# Patient Record
Sex: Male | Born: 1980 | Race: Black or African American | Hispanic: No | Marital: Single
Health system: Southern US, Community
[De-identification: ages and names within clinical notes are randomized; demographics above are authoritative.]

## PROBLEM LIST (undated history)

## (undated) DIAGNOSIS — F319 Bipolar disorder, unspecified: Secondary | ICD-10-CM

## (undated) DIAGNOSIS — O2230 Deep phlebothrombosis in pregnancy, unspecified trimester: Secondary | ICD-10-CM

## (undated) DIAGNOSIS — I10 Essential (primary) hypertension: Secondary | ICD-10-CM

---

## 2009-01-20 ENCOUNTER — Emergency Department (HOSPITAL_COMMUNITY): Admission: EM | Admit: 2009-01-20 | Discharge: 2009-01-20 | Payer: Self-pay | Admitting: Family Medicine

## 2009-12-30 ENCOUNTER — Emergency Department (HOSPITAL_COMMUNITY): Admission: AC | Admit: 2009-12-30 | Discharge: 2009-12-30 | Payer: Self-pay

## 2011-01-20 LAB — CBC
HCT: 45.6 % (ref 39.0–52.0)
MCHC: 33.8 g/dL (ref 30.0–36.0)
MCV: 102.3 fL — ABNORMAL HIGH (ref 78.0–100.0)
Platelets: 251 10*3/uL (ref 150–400)
RBC: 4.46 MIL/uL (ref 4.22–5.81)
RDW: 14 % (ref 11.5–15.5)

## 2011-01-20 LAB — BASIC METABOLIC PANEL
CO2: 10 mEq/L — ABNORMAL LOW (ref 19–32)
Calcium: 9.3 mg/dL (ref 8.4–10.5)
GFR calc non Af Amer: 56 mL/min — ABNORMAL LOW (ref >60)
Glucose, Bld: 137 mg/dL — ABNORMAL HIGH (ref 70–99)
Potassium: 4.1 mEq/L (ref 3.5–5.1)

## 2011-01-20 LAB — DIFFERENTIAL
Basophils Relative: 0 % (ref 0–1)
Eosinophils Absolute: 0.3 10*3/uL (ref 0.0–0.7)
Lymphs Abs: 2.5 10*3/uL (ref 0.7–4.0)

## 2011-01-20 LAB — POCT CARDIAC MARKERS
Myoglobin, poc: 265 ng/mL (ref 12–200)
Troponin i, poc: <0.05 ng/mL (ref 0.00–0.09)

## 2011-06-25 ENCOUNTER — Emergency Department (HOSPITAL_COMMUNITY)
Admission: EM | Admit: 2011-06-25 | Discharge: 2011-06-25 | Disposition: A | Discharge status: Home / Self Care | Payer: Self-pay | Attending: Emergency Medicine | Admitting: Emergency Medicine

## 2011-06-25 DIAGNOSIS — I10 Essential (primary) hypertension: Secondary | ICD-10-CM | POA: Insufficient documentation

## 2011-06-25 DIAGNOSIS — M546 Pain in thoracic spine: Secondary | ICD-10-CM | POA: Insufficient documentation

## 2011-06-25 DIAGNOSIS — Z79899 Other long term (current) drug therapy: Secondary | ICD-10-CM | POA: Insufficient documentation

## 2018-04-25 ENCOUNTER — Encounter (HOSPITAL_COMMUNITY): Payer: Self-pay | Admitting: Emergency Medicine

## 2018-04-25 ENCOUNTER — Emergency Department (HOSPITAL_COMMUNITY)
Admission: EM | Admit: 2018-04-25 | Discharge: 2018-04-25 | Disposition: A | Discharge status: Home / Self Care | Payer: Self-pay | Attending: Emergency Medicine | Admitting: Emergency Medicine

## 2018-04-25 ENCOUNTER — Emergency Department (HOSPITAL_COMMUNITY): Payer: Self-pay

## 2018-04-25 DIAGNOSIS — R079 Chest pain, unspecified: Secondary | ICD-10-CM | POA: Insufficient documentation

## 2018-04-25 DIAGNOSIS — Z86718 Personal history of other venous thrombosis and embolism: Secondary | ICD-10-CM | POA: Insufficient documentation

## 2018-04-25 DIAGNOSIS — M79662 Pain in left lower leg: Secondary | ICD-10-CM | POA: Insufficient documentation

## 2018-04-25 DIAGNOSIS — I10 Essential (primary) hypertension: Secondary | ICD-10-CM | POA: Insufficient documentation

## 2018-04-25 DIAGNOSIS — Z7901 Long term (current) use of anticoagulants: Secondary | ICD-10-CM | POA: Insufficient documentation

## 2018-04-25 DIAGNOSIS — F17210 Nicotine dependence, cigarettes, uncomplicated: Secondary | ICD-10-CM | POA: Insufficient documentation

## 2018-04-25 HISTORY — DX: Essential (primary) hypertension: I10

## 2018-04-25 HISTORY — DX: Deep phlebothrombosis in pregnancy, unspecified trimester: O22.30

## 2018-04-25 LAB — CBC WITH DIFFERENTIAL/PLATELET
ABS IMMATURE GRANULOCYTES: 0 10*3/uL (ref 0.0–0.1)
BASOS ABS: 0 10*3/uL (ref 0.0–0.1)
Basophils Relative: 1 %
Eosinophils Absolute: 0.2 10*3/uL (ref 0.0–0.7)
Eosinophils Relative: 4 %
HEMATOCRIT: 42.4 % (ref 39.0–52.0)
HEMOGLOBIN: 14 g/dL (ref 13.0–17.0)
Immature Granulocytes: 0 %
LYMPHS ABS: 1.2 10*3/uL (ref 0.7–4.0)
LYMPHS PCT: 25 %
MCH: 34.1 pg — ABNORMAL HIGH (ref 26.0–34.0)
MCHC: 33 g/dL (ref 30.0–36.0)
MCV: 103.2 fL — ABNORMAL HIGH (ref 78.0–100.0)
MONO ABS: 0.4 10*3/uL (ref 0.1–1.0)
Monocytes Relative: 8 %
Neutro Abs: 3.1 10*3/uL (ref 1.7–7.7)
Neutrophils Relative %: 62 %
Platelets: 272 10*3/uL (ref 150–400)
RBC: 4.11 MIL/uL — AB (ref 4.22–5.81)
RDW: 14.6 % (ref 11.5–15.5)
WBC: 4.9 10*3/uL (ref 4.0–10.5)

## 2018-04-25 LAB — I-STAT TROPONIN, ED: TROPONIN I, POC: 0 ng/mL (ref 0.00–0.08)

## 2018-04-25 LAB — BASIC METABOLIC PANEL
ANION GAP: 7 (ref 5–15)
BUN: 13 mg/dL (ref 6–20)
CHLORIDE: 106 mmol/L (ref 98–111)
CO2: 25 mmol/L (ref 22–32)
Calcium: 9.2 mg/dL (ref 8.9–10.3)
Creatinine, Ser: 0.96 mg/dL (ref 0.61–1.24)
GFR calc Af Amer: >60 mL/min (ref >60)
GLUCOSE: 87 mg/dL (ref 70–99)
POTASSIUM: 3.6 mmol/L (ref 3.5–5.1)
SODIUM: 138 mmol/L (ref 135–145)

## 2018-04-25 MED ORDER — APIXABAN 5 MG PO TABS: 5.0000 mg | ORAL_TABLET | Freq: Two times a day (BID) | ORAL | 0 refills | Status: AC

## 2018-04-25 MED ORDER — IOPAMIDOL (ISOVUE-370) INJECTION 76%
INTRAVENOUS | Status: AC
  Filled 2018-04-25: qty 100

## 2018-04-25 MED ORDER — IOPAMIDOL (ISOVUE-370) INJECTION 76%
100.0000 mL | Freq: Once | INTRAVENOUS | Status: AC | PRN
  Administered 2018-04-25: 100 mL via INTRAVENOUS

## 2018-04-25 NOTE — Discharge Instructions (Signed)
Follow-up at the wellness center for further evaluation. Return to ED for worsening symptoms, chest pain, shortness of breath, coughing up blood.

## 2018-04-25 NOTE — ED Provider Notes (Addendum)
[facility] EMERGENCY DEPARTMENT Provider Note   CSN: [id number] Arrival date & time: 04/25/18  1219     History   Chief Complaint Chief Complaint  Patient presents with  . Leg Pain    HPI Donald Wilson is a 37 y.o. male with a past medical history of DVT on Eliquis, who presents to ED for evaluation of ongoing left leg pain.  He was originally diagnosed with a DVT in 2015.  He has been on Eliquis 5mg  since then up until 2 to 3 months ago.  He states that he has relocated to [place] from [place] and has not yet established with a primary care provider here.  He reports ongoing pain since then.  He states that the DVT that he was diagnosed with was unprovoked but states that he does have a family history of it. He reports some chest pain and shortness of breath intermittently but none currently.  Denies any hemoptysis, fever, history of MI, recent surgeries, recent prolonged travel, syncope.  HPI  Past Medical History:  Diagnosis Date  . 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 Hypertension     There are no active problems to display for this patient.   History reviewed. No pertinent surgical history.      Home Medications    Prior to Admission medications   Medication Sig Start Date End Date Taking? Authorizing Provider  apixaban (ELIQUIS) 5 MG TABS tablet Take 1 tablet (5 mg total) by mouth 2 (two) times daily. 04/25/18   Dietrich Pates, PA-C    Family History No family history on file.  Social History Social History   Tobacco Use  . Smoking status: Current Every Day Smoker    Packs/day: 0.50    Types: Cigarettes  . Smokeless tobacco: Never Used  Substance Use Topics  . Alcohol use: Yes    Comment: occ  . Drug use: Yes    Types: Marijuana     Allergies   Patient has no known allergies.   Review of Systems Review of Systems  Constitutional: Negative for appetite change, chills and fever.  HENT: Negative for ear pain,  rhinorrhea, sneezing and sore throat.   Eyes: Negative for photophobia and visual disturbance.  Respiratory: Positive for shortness of breath. Negative for cough, chest tightness and wheezing.   Cardiovascular: Positive for chest pain. Negative for palpitations.  Gastrointestinal: Negative for abdominal pain, blood in stool, constipation, diarrhea, nausea and vomiting.  Genitourinary: Negative for dysuria, hematuria and urgency.  Musculoskeletal: Negative for myalgias.  Skin: Negative for rash.  Neurological: Negative for dizziness, weakness and light-headedness.     Physical Exam Updated Vital Signs BP (!) 157/86 (BP Location: Right Arm)   Pulse 96   Temp 98.5 F (36.9 C) (Oral)   Resp 18   Ht 6\' 3"  (1.905 m)   Wt 86.2 kg (190 lb)   SpO2 100%   BMI 23.75 kg/m   Physical Exam  Constitutional: He appears well-developed and well-nourished. No distress.  HENT:  Head: Normocephalic and atraumatic.  Nose: Nose normal.  Eyes: Conjunctivae and EOM are normal. Right eye exhibits no discharge. Left eye exhibits no discharge. No scleral icterus.  Neck: Normal range of motion. Neck supple.  Cardiovascular: Normal rate, regular rhythm, normal heart sounds and intact distal pulses. Exam reveals no gallop and no friction rub.  No murmur heard. Pulmonary/Chest: Effort normal and breath sounds normal. No respiratory distress.  Abdominal: Soft. Bowel sounds are normal. He  exhibits no distension. There is no tenderness. There is no guarding.  Musculoskeletal: Normal range of motion. He exhibits no edema.  Left calf tenderness noted.  No erythema, warmth or edema of leg noted. 2+ DP pulses, symmetrically in BLE.  Neurological: He is alert. He exhibits normal muscle tone. Coordination normal.  Skin: Skin is warm and dry. No rash noted.  Psychiatric: He has a normal mood and affect.  Nursing note and vitals reviewed.    ED Treatments / Results  Labs (all labs ordered are listed, but only  abnormal results are displayed) Labs Reviewed  CBC WITH DIFFERENTIAL/PLATELET - Abnormal; Notable for the following components:      Result Value   RBC 4.11 (*)    MCV 103.2 (*)    MCH 34.1 (*)    All other components within normal limits  BASIC METABOLIC PANEL  I-STAT TROPONIN, ED    EKG None  Radiology Ct Angio Chest Pe W/cm &/or Wo Cm  Result Date: 04/25/2018 CLINICAL DATA:  Left lower leg pain chronically but worse recently. No injury. History of DVT as patient out of Eliquis 2 months. EXAM: CT ANGIOGRAPHY CHEST WITH CONTRAST TECHNIQUE: Multidetector CT imaging of the chest was performed using the standard protocol during bolus administration of intravenous contrast. Multiplanar CT image reconstructions and MIPs were obtained to evaluate the vascular anatomy. CONTRAST:  100mL ISOVUE-370 IOPAMIDOL (ISOVUE-370) INJECTION 76% COMPARISON:  None. FINDINGS: Cardiovascular: Heart is normal size. No evidence of pulmonary embolism. Ascending thoracic aorta measures 3.5 cm in AP diameter. Remaining vascular structures are unremarkable. Mediastinum/Nodes: No evidence of mediastinal or hilar adenopathy. Remaining mediastinal structures are normal. Lungs/Pleura: Lungs are well inflated without focal airspace consolidation or effusion. There is a tiny amount of dependent debris over the distal trachea which may be due to aspiration. Upper Abdomen: No acute abnormality. Musculoskeletal: No chest wall abnormality. No acute or significant osseous findings. Review of the MIP images confirms the above findings. IMPRESSION: No acute cardiopulmonary disease. No evidence of pulmonary embolism. 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 Recommend annual imaging followup by CTA or MRA. This recommendation follows 2010 ACCF/AHA/AATS/ACR/ASA/SCA/SCAI/SIR/STS/SVM Guidelines for the Diagnosis and Management of Patients with Thoracic Aortic Disease. Circulation.2010; 121: Z610-R604: e266-e369. Minimal dependent  debris over the distal trachea likely aspirate material. Electronically Signed   By: Elberta Fortisaniel  Boyle M.D.   On: 04/25/2018 16:08    Procedures Procedures (including critical care time)  Medications Ordered in ED Medications  iopamidol (ISOVUE-370) 76 % injection (has no administration in time range)  iopamidol (ISOVUE-370) 76 % injection 100 mL (100 mLs Intravenous Contrast Given 04/25/18 1527)     Initial Impression / Assessment and Plan / ED Course  I have reviewed the triage vital signs and the nursing notes.  Pertinent labs & imaging results that were available during my care of the patient were reviewed by me and considered in my medical decision making (see chart for details).     Patient, with a past medical history of DVT presents to ED for evaluation of ongoing left leg pain.  He was originally diagnosed with a DVT in 2015.  He has been on Eliquis 5 mg since then up until 2 to 3 months ago.  [place] and has not yet established with a primary care provider here.  Some intermittent chest pain and shortness of breath.  Denies any currently. He denies any hemoptysis, fever, history of MI, recent surgeries, recent prolonged travel  or syncope. Patient would like to be checked for blood clot in lung. CTA negative. Labwork including troponin unremarkable.  EKG shows normal sinus rhythm.  He is low risk by heart score.  No signs of DVT on today's examination. Patient will be restarted back on Eliquis 5 mg which he was told to take indefinitely. Will give follow up information for Wellness center for follow up and to return to ED for any severe or worsening symptoms.  Advised to return to ED for any severe worsening symptoms.  Portions of this note were generated with Scientist, clinical (histocompatibility and immunogenetics). Dictation errors may occur despite best attempts at proofreading.   Final Clinical Impressions(s) / ED Diagnoses   Final diagnoses:  History of DVT (deep vein  thrombosis)    ED Discharge Orders        Ordered    apixaban (ELIQUIS) 5 MG TABS tablet  2 times daily     04/25/18 [address], PA-C 04/25/18 1704    Bethann Berkshire, MD 04/25/18 2141

## 2018-04-25 NOTE — ED Triage Notes (Signed)
Patient to ED c/o chronic L lower leg pain (reports he's had the same pain since 2015, but the pain is getting worse). Denies new injury. No swelling, redness, or tenderness noted compared to other leg. Patient reports hx DVT and he's been out of his Eliquis for nearly 2 months. Does not have a primary doctor here. Ambulatory with steady gait. Pulses equal.

## 2018-05-29 ENCOUNTER — Encounter (HOSPITAL_COMMUNITY): Payer: Self-pay

## 2018-05-29 ENCOUNTER — Emergency Department (HOSPITAL_COMMUNITY)
Admission: EM | Admit: 2018-05-29 | Discharge: 2018-05-29 | Disposition: A | Discharge status: Home / Self Care | Payer: Self-pay | Attending: Emergency Medicine | Admitting: Emergency Medicine

## 2018-05-29 ENCOUNTER — Emergency Department (HOSPITAL_BASED_OUTPATIENT_CLINIC_OR_DEPARTMENT_OTHER): Payer: Self-pay

## 2018-05-29 ENCOUNTER — Other Ambulatory Visit: Payer: Self-pay

## 2018-05-29 DIAGNOSIS — M79605 Pain in left leg: Secondary | ICD-10-CM | POA: Insufficient documentation

## 2018-05-29 DIAGNOSIS — I10 Essential (primary) hypertension: Secondary | ICD-10-CM | POA: Insufficient documentation

## 2018-05-29 DIAGNOSIS — M79609 Pain in unspecified limb: Secondary | ICD-10-CM

## 2018-05-29 DIAGNOSIS — F17210 Nicotine dependence, cigarettes, uncomplicated: Secondary | ICD-10-CM | POA: Insufficient documentation

## 2018-05-29 DIAGNOSIS — Z7901 Long term (current) use of anticoagulants: Secondary | ICD-10-CM | POA: Insufficient documentation

## 2018-05-29 NOTE — Care Management (Signed)
Consult received for medication assistance for Eliquis.  Pt states he was on Eliquis for about 3 years in [place].  He states he was admitted to a hospital with a DVT and had prescription coverage when he was d/c'd.  He states that he didn't pay anything for the Eliquis and doesn't think he was insured, though he may have had Medicaid.  He is working since he moved here and doesn't know if he will be eligible for insurance.    Pt states he never used a 30 day free card.  Pt given card to try to use.  Pt given CM's name and instructed to call back if cannot get prescription filled. Phone number for patient assistance program also given to patient.     Clinic information placed on AVS. Pt states he will try to get appointment at a clinic and understand he will be able to [facility].

## 2018-05-29 NOTE — ED Notes (Signed)
Vascular tech is aware of patient 

## 2018-05-29 NOTE — ED Notes (Signed)
See triage note. Patient alert and oriented, NAD

## 2018-05-29 NOTE — Progress Notes (Signed)
VASCULAR LAB PRELIMINARY  PRELIMINARY  PRELIMINARY  PRELIMINARY  Left lower extremity venous duplex completed.    Preliminary report:  There is no DVT or SVT noted in the left lower extremity.  Gave results to [facility], PA-C  Leonidus Rowand, [facility], RVT 05/29/2018, 12:59 PM

## 2018-05-29 NOTE — ED Provider Notes (Signed)
[facility] EMERGENCY DEPARTMENT Provider Note   CSN: [id number] Arrival date & time: 05/29/18  [id number]     History   Chief Complaint No chief complaint on file.   HPI Donald Wilson is a 37 y.o. male who presents to the ED with left lower leg pain x 1 week. Patient with hx of DVT and had US one month ago that was negative. Last DVT 5 years ago. Patient reports that over the past week he has had increased pain that has gotten so bad he could not work today. The patient states that he moved here from [place] state and that the doctor he had previously told him to never be off of his blood thinner. Patient states no one has helped him to get his medication even when he was here a month ago. He states he does not have money for the medication. Patient appears to be upset that he was not given his medication. Patient was given Rx at the last visit but he did not fill the Rx.   HPI  Past Medical History:  Diagnosis Date  . 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 Hypertension     There are no active problems to display for this patient.   History reviewed. No pertinent surgical history.      Home Medications    Prior to Admission medications   Medication Sig Start Date End Date Taking? Authorizing Provider  apixaban (ELIQUIS) 5 MG TABS tablet Take 1 tablet (5 mg total) by mouth 2 (two) times daily. 04/25/18   Dietrich PatesKhatri, [place], PA-C    Family History No family history on file.  Social History Social History   Tobacco Use  . Smoking status: Current Every Day Smoker    Packs/day: 0.50    Types: Cigarettes  . Smokeless tobacco: Never Used  Substance Use Topics  . Alcohol use: Yes    Comment: occ  . Drug use: Yes    Types: Marijuana     Allergies   Patient has no known allergies.   Review of Systems Review of Systems  Musculoskeletal: Positive for arthralgias.       Left leg pain  All other systems reviewed and are negative.    Physical  Exam Updated Vital Signs BP (!) 136/92 (BP Location: Right Arm)   Pulse 65   Temp 97.7 F (36.5 C) (Oral)   Resp 16   SpO2 100%   Physical Exam  Constitutional: He appears well-developed and well-nourished. No distress.  HENT:  Head: Normocephalic.  Eyes: EOM are normal.  Neck: Neck supple.  Cardiovascular: Normal rate.  Pulmonary/Chest: Effort normal.  Musculoskeletal: Normal range of motion.       Left lower leg: He exhibits tenderness. He exhibits no swelling and no deformity.  Pedal pulses 2+, calf is soft but patient complains of pain on exam.  Neurological: He is alert.  Skin: Skin is warm and dry.  Psychiatric: He has a normal mood and affect.  Nursing note and vitals reviewed.    ED Treatments / Results  Labs (all labs ordered are listed, but only abnormal results are displayed) Labs Reviewed - No data to display Radiology No results found.  Procedures Procedures (including critical care time)  Medications Ordered in ED Medications - No data to display   Initial Impression / Assessment and Plan / ED Course  I have reviewed the triage vital signs and the nursing notes. 37 y.o. male here with left calf  pain x 1 week stable for d/c with doppler study negative for DVT. Case manager here to see the patient and is able to get patient one month supply of his medication. She was also able to give patient referral for general health care.   Final Clinical Impressions(s) / ED Diagnoses   Final diagnoses:  Pain of left lower extremity    ED Discharge Orders    None       Kerrie [place], [place] 05/29/18 1754    Raeford Razor, MD 05/30/18 [phone]

## 2018-05-29 NOTE — Discharge Instructions (Addendum)
The ultrasound of your leg today shows no blood clot. The case manager has given you information for follow up and the help you need with your medication. Call to schedule follow up.

## 2018-05-29 NOTE — ED Triage Notes (Signed)
Patient complains of left lower leg pain x 1 week. Has hx of DVT. Reports US 1 month ago that was negative. Has had last DVT 5 years ago. Alert and oriented, NAD. Also complains of B12 low and intermittent lower back pain,

## 2018-06-20 ENCOUNTER — Emergency Department (HOSPITAL_COMMUNITY)
Admission: EM | Admit: 2018-06-20 | Discharge: 2018-06-21 | Disposition: A | Discharge status: Home / Self Care | Payer: Self-pay | Attending: Emergency Medicine | Admitting: Emergency Medicine

## 2018-06-20 ENCOUNTER — Encounter (HOSPITAL_COMMUNITY): Payer: Self-pay

## 2018-06-20 DIAGNOSIS — I77810 Thoracic aortic ectasia: Secondary | ICD-10-CM | POA: Insufficient documentation

## 2018-06-20 DIAGNOSIS — R55 Syncope and collapse: Secondary | ICD-10-CM | POA: Insufficient documentation

## 2018-06-20 DIAGNOSIS — R0789 Other chest pain: Secondary | ICD-10-CM

## 2018-06-20 DIAGNOSIS — I10 Essential (primary) hypertension: Secondary | ICD-10-CM | POA: Insufficient documentation

## 2018-06-20 DIAGNOSIS — F17210 Nicotine dependence, cigarettes, uncomplicated: Secondary | ICD-10-CM | POA: Insufficient documentation

## 2018-06-20 DIAGNOSIS — F1910 Other psychoactive substance abuse, uncomplicated: Secondary | ICD-10-CM | POA: Insufficient documentation

## 2018-06-20 NOTE — ED Triage Notes (Signed)
Pt coming from friends house by ems. Pt was eating when pt started to have chest pain and also had a syncopal episode where he fell out of his chair but did not hit his head. Pt came to it after 30 seconds. Pt is axox4 at this time. Pt does have hx of dvt and hasn't taken his eliquis and also has hx of htn. Pt was given 324 asa and 1 nitro for the chest pain. Pt states it is a 6/10 at this time.

## 2018-06-21 ENCOUNTER — Emergency Department (HOSPITAL_COMMUNITY): Payer: Self-pay

## 2018-06-21 ENCOUNTER — Encounter (HOSPITAL_COMMUNITY): Payer: Self-pay | Admitting: Emergency Medicine

## 2018-06-21 LAB — BASIC METABOLIC PANEL
Anion gap: 7 (ref 5–15)
BUN: 9 mg/dL (ref 6–20)
CALCIUM: 8.9 mg/dL (ref 8.9–10.3)
CHLORIDE: 111 mmol/L (ref 98–111)
CO2: 25 mmol/L (ref 22–32)
CREATININE: 1.09 mg/dL (ref 0.61–1.24)
Glucose, Bld: 120 mg/dL — ABNORMAL HIGH (ref 70–99)
Potassium: 4.1 mmol/L (ref 3.5–5.1)
SODIUM: 143 mmol/L (ref 135–145)

## 2018-06-21 LAB — CBC
HCT: 39.9 % (ref 39.0–52.0)
Hemoglobin: 13.2 g/dL (ref 13.0–17.0)
MCH: 35.3 pg — ABNORMAL HIGH (ref 26.0–34.0)
MCHC: 33.1 g/dL (ref 30.0–36.0)
MCV: 106.7 fL — ABNORMAL HIGH (ref 78.0–100.0)
PLATELETS: 248 10*3/uL (ref 150–400)
RBC: 3.74 MIL/uL — AB (ref 4.22–5.81)
RDW: 13.2 % (ref 11.5–15.5)
WBC: 6.5 10*3/uL (ref 4.0–10.5)

## 2018-06-21 LAB — ETHANOL: Alcohol, Ethyl (B): <10 mg/dL (ref <10)

## 2018-06-21 LAB — I-STAT TROPONIN, ED
TROPONIN I, POC: 0 ng/mL (ref 0.00–0.08)
TROPONIN I, POC: 0.02 ng/mL (ref 0.00–0.08)

## 2018-06-21 LAB — RAPID URINE DRUG SCREEN, HOSP PERFORMED
Amphetamines: NOT DETECTED
Barbiturates: NOT DETECTED
Benzodiazepines: NOT DETECTED
Cocaine: POSITIVE — AB
OPIATES: NOT DETECTED
TETRAHYDROCANNABINOL: POSITIVE — AB

## 2018-06-21 LAB — CBG MONITORING, ED: GLUCOSE-CAPILLARY: 115 mg/dL — AB (ref 70–99)

## 2018-06-21 NOTE — ED Notes (Signed)
PT states understanding of care given, follow up care, and medication prescribed. Pt has no questions at this time. PT  ambulated from ED to car with a steady gait.  

## 2018-06-21 NOTE — Discharge Instructions (Addendum)
We saw you in the ER after you fainted. All of our cardiac workup is normal, including labs, EKG and chest X-RAY are normal. We are not 100% sure why you fainted.  As discussed, your heart vessel that comes out of the heart is wide, and if you continue to smoke heavily or use cocaine it is at risk of rupturing.  The size of the blood vessel needs to be watched by a primary care doctor.  We have provided you with the phone # for [facility], call them for a nearest appointment.  Please return to the ER if you have worsening chest pain, shortness of breath, pain radiating to your jaw, shoulder, or back, sweats or fainting. Otherwise see the primary care doctor as requested.

## 2018-06-21 NOTE — ED Provider Notes (Signed)
[facility] EMERGENCY DEPARTMENT Provider Note   CSN: [id number] Arrival date & time: 06/20/18  2336     History   Chief Complaint Chief Complaint  Patient presents with  . Chest Pain  . Loss of Consciousness    HPI Thurmon FairRussell Eslinger is a 37 y.o. male.  HPI 37 year old male with history of DVT, hypertension comes in with chief of chest pain and questionable syncope. According to EMS, patient was brought here from his friend's home after he had a syncopal episode.  Patient was eating when he started complaining of chest pain followed by a fainting spell.  Patient had LOC for about 30 seconds.  When EMS arrived, patient was coming around.  He has history of DVT, however he has not taken any blood thinners because of lack of insurance.   Patient reports that he has left-sided chest discomfort.  Pain is described as tightness type pain, 6 out of 10.  There is no specific aggravating or relieving factors.  Patient has had similar pain in the past.  He denies any shortness of breath.  Patient denies any episodes of fainting before, and he does not think he fainted even this time around.  Patient denies any substance abuse.  He admits to alcohol intake with his friends, but denies heavy drinking.  Past Medical History:  Diagnosis Date  . 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 Hypertension     There are no active problems to display for this patient.   History reviewed. No pertinent surgical history.      Home Medications    Prior to Admission medications   Medication Sig Start Date End Date Taking? Authorizing Provider  apixaban (ELIQUIS) 5 MG TABS tablet Take 1 tablet (5 mg total) by mouth 2 (two) times daily. Patient not taking: Reported on 06/21/2018 04/25/18   Dietrich PatesKhatri, [place], PA-C    Family History History reviewed. No pertinent family history.  Social History Social History   Tobacco Use  . Smoking status: Current Every Day Smoker   Packs/day: 1.00    Types: Cigarettes  . Smokeless tobacco: Never Used  Substance Use Topics  . Alcohol use: Yes    Comment: occ  . Drug use: Yes    Types: Marijuana, Cocaine     Allergies   Patient has no known allergies.   Review of Systems Review of Systems  Constitutional: Positive for activity change.  Respiratory: Negative for shortness of breath.   Cardiovascular: Positive for chest pain.  Gastrointestinal: Negative for nausea and vomiting.  Neurological: Positive for syncope. Negative for numbness and headaches.  Hematological: Does not bruise/bleed easily.  All other systems reviewed and are negative.    Physical Exam Updated Vital Signs BP 118/79   Pulse 65   Temp 97.8 F (36.6 C) (Oral)   Resp 19   Ht 6\' 3"  (1.905 m)   Wt 83.9 kg   SpO2 99%   BMI 23.12 kg/m   Physical Exam  Constitutional: He is oriented to person, place, and time. He appears well-developed.  HENT:  Head: Atraumatic.  Neck: Neck supple. No JVD present.  Cardiovascular: Normal rate, regular rhythm, intact distal pulses and normal pulses.  Pulmonary/Chest: Effort normal.  Musculoskeletal:       Right lower leg: He exhibits no tenderness and no edema.       Left lower leg: He exhibits no tenderness and no edema.  Neurological: He is oriented to person, place, and time.  Somnolent  Skin: Skin is warm.  Nursing note and vitals reviewed.    ED Treatments / Results  Labs (all labs ordered are listed, but only abnormal results are displayed) Labs Reviewed  BASIC METABOLIC PANEL - Abnormal; Notable for the following components:      Result Value   Glucose, Bld 120 (*)    All other components within normal limits  CBC - Abnormal; Notable for the following components:   RBC 3.74 (*)    MCV 106.7 (*)    MCH 35.3 (*)    All other components within normal limits  RAPID URINE DRUG SCREEN, HOSP PERFORMED - Abnormal; Notable for the following components:   Cocaine POSITIVE (*)     Tetrahydrocannabinol POSITIVE (*)    All other components within normal limits  CBG MONITORING, ED - Abnormal; Notable for the following components:   Glucose-Capillary 115 (*)    All other components within normal limits  ETHANOL  I-STAT TROPONIN, ED  I-STAT TROPONIN, ED    EKG EKG Interpretation  Date/Time:  Sunday June 20 2018 23:47:08 EDT Ventricular Rate:  84 PR Interval:    QRS Duration: 81 QT Interval:  363 QTC Calculation: 430 R Axis:   62 Text Interpretation:  Sinus rhythm ST elev, probable normal early repol pattern No acute changes No significant change since last tracing Confirmed by Derwood Kaplan [phone]) on 06/21/2018 1:22:15 AM   Radiology Dg Chest 2 View  Result Date: 06/21/2018 CLINICAL DATA:  Chest pain EXAM: CHEST - 2 VIEW COMPARISON:  Chest CT 04/25/2018 FINDINGS: Heart and mediastinal contours are within normal limits. No focal opacities or effusions. No acute bony abnormality. IMPRESSION: No active cardiopulmonary disease. Electronically Signed   By: Charlett Nose M.D.   On: 06/21/2018 00:33    Procedures Procedures (including critical care time)  Smoking cessation instruction/counseling given:  counseled patient on the dangers of tobacco use, advised patient to stop smoking, and reviewed strategies to maximize success. Discussion for 2-3 min.   Medications Ordered in ED Medications - No data to display   Initial Impression / Assessment and Plan / ED Course  I have reviewed the triage vital signs and the nursing notes.  Pertinent labs & imaging results that were available during my care of the patient were reviewed by me and considered in my medical decision making (see chart for details).  Clinical Course as of Jun 21 550  Mon Jun 21, 2018  0300 Patient is sleeping comfortably.  Heart rate is normal.  No hypoxia. Patient continues to be very sleepy.  He has been requested again to give [place] a urine sample.   [AN]  0430 UDS is positive for  cocaine. Repeat troponin pending.  Patient reports is chest pain-free now.   [AN]  0550 Results from the ER workup discussed with the patient face to face and all questions answered to the best of my ability.  Patient made aware of his thoracic artery ectasia, and need for him to stop smoking and using drugs.  Troponin i, poc: 0.02 [AN]    Clinical Course User Index [AN] Derwood Kaplan, MD    DDx includes: Orthostatic hypotension Stroke Vertebral artery dissection/stenosis Dysrhythmia PE Vasovagal/neurocardiogenic syncope Aortic stenosis Valvular disorder/Cardiomyopathy Anemia  Young man with history of DVT and hypertension comes in after a syncopal episode.  Patient is slightly somnolent, but he answers all questions appropriately.  It seems that he had an alcohol and potentially marijuana earlier today, and therefore his somnolence could  be a result of substance use.  At the moment patient is EKG is reassuring.  He is not in any respiratory distress and his O2 sats normal.  Patient had a DVT study earlier in the month which was negative, he had a CT PE in the last 3 months which was also negative for PE.  The CAT scan did have an incidental finding of thoracic artery dilatation.  Patient's pulse exam is normal.  He has no back pain.  Patient is not hypotensive, and therefore we do not think he is having ascending dissection.  Plan is for [place] to monitor the patient closely in the ER for at least 4 hours.  We will reassess him.  Delta troponins have been ordered.   Final Clinical Impressions(s) / ED Diagnoses   Final diagnoses:  Syncope and collapse  Atypical chest pain  Thoracic aortic ectasia (HCC)  Polysubstance abuse [facility])    ED Discharge Orders    None       Derwood Kaplan, MD 06/21/18 [phone]

## 2018-07-27 ENCOUNTER — Emergency Department (HOSPITAL_COMMUNITY)
Admission: EM | Admit: 2018-07-27 | Discharge: 2018-07-27 | Disposition: A | Discharge status: Home / Self Care | Payer: Self-pay | Attending: Emergency Medicine | Admitting: Emergency Medicine

## 2018-07-27 ENCOUNTER — Encounter (HOSPITAL_COMMUNITY): Payer: Self-pay | Admitting: Emergency Medicine

## 2018-07-27 ENCOUNTER — Other Ambulatory Visit: Payer: Self-pay

## 2018-07-27 ENCOUNTER — Emergency Department (HOSPITAL_COMMUNITY): Payer: Self-pay

## 2018-07-27 DIAGNOSIS — R0789 Other chest pain: Secondary | ICD-10-CM | POA: Insufficient documentation

## 2018-07-27 DIAGNOSIS — I10 Essential (primary) hypertension: Secondary | ICD-10-CM | POA: Insufficient documentation

## 2018-07-27 HISTORY — DX: Bipolar disorder, unspecified: F31.9

## 2018-07-27 LAB — I-STAT TROPONIN, ED: Troponin i, poc: 0 ng/mL (ref 0.00–0.08)

## 2018-07-27 LAB — CBC WITH DIFFERENTIAL/PLATELET
Abs Immature Granulocytes: 0 10*3/uL (ref 0.0–0.1)
Basophils Absolute: 0 10*3/uL (ref 0.0–0.1)
Basophils Relative: 1 %
EOS ABS: 0.2 10*3/uL (ref 0.0–0.7)
Eosinophils Relative: 3 %
HEMATOCRIT: 43.8 % (ref 39.0–52.0)
Hemoglobin: 14.6 g/dL (ref 13.0–17.0)
IMMATURE GRANULOCYTES: 0 %
LYMPHS ABS: 1.1 10*3/uL (ref 0.7–4.0)
Lymphocytes Relative: 17 %
MCH: 35 pg — ABNORMAL HIGH (ref 26.0–34.0)
MCHC: 33.3 g/dL (ref 30.0–36.0)
MCV: 105 fL — AB (ref 78.0–100.0)
Monocytes Absolute: 0.4 10*3/uL (ref 0.1–1.0)
Monocytes Relative: 6 %
NEUTROS PCT: 73 %
Neutro Abs: 4.8 10*3/uL (ref 1.7–7.7)
Platelets: 228 10*3/uL (ref 150–400)
RBC: 4.17 MIL/uL — AB (ref 4.22–5.81)
RDW: 12.1 % (ref 11.5–15.5)
WBC: 6.6 10*3/uL (ref 4.0–10.5)

## 2018-07-27 LAB — COMPREHENSIVE METABOLIC PANEL
ALBUMIN: 4.3 g/dL (ref 3.5–5.0)
ALK PHOS: 68 U/L (ref 38–126)
ALT: 14 U/L (ref 0–44)
AST: 17 U/L (ref 15–41)
Anion gap: 10 (ref 5–15)
BILIRUBIN TOTAL: 1.5 mg/dL — AB (ref 0.3–1.2)
BUN: 9 mg/dL (ref 6–20)
CO2: 27 mmol/L (ref 22–32)
Calcium: 9.2 mg/dL (ref 8.9–10.3)
Chloride: 102 mmol/L (ref 98–111)
Creatinine, Ser: 0.98 mg/dL (ref 0.61–1.24)
GFR calc Af Amer: >60 mL/min (ref >60)
GLUCOSE: 89 mg/dL (ref 70–99)
POTASSIUM: 3.7 mmol/L (ref 3.5–5.1)
Sodium: 139 mmol/L (ref 135–145)
TOTAL PROTEIN: 7 g/dL (ref 6.5–8.1)

## 2018-07-27 LAB — D-DIMER, QUANTITATIVE: D-Dimer, Quant: 0.5 ug/mL-FEU (ref 0.00–0.50)

## 2018-07-27 LAB — PROTIME-INR
INR: 1.05
PROTHROMBIN TIME: 13.7 s (ref 11.4–15.2)

## 2018-07-27 NOTE — ED Provider Notes (Signed)
[facility] EMERGENCY DEPARTMENT Provider Note   CSN: [id number] Arrival date & time: 07/27/18  1307     History   Chief Complaint Chief Complaint  Patient presents with  . Shortness of Breath    HPI Donald Wilson is a 37 y.o. male.  HPI    37 year old male presents today with complaints of left-sided chest pain.  Patient is a poor historian, he notes that he has had sharp pain intermittently on the left side of his chest.  He notes this comes out of the blue and then resolves on its own.  He notes is worsened with palpation of left anterior chest wall.  He denies any pain presently, notes intermittent shortness of breath.  Patient denies any lower extremity swelling or edema, fever chills, cough.  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  He notes that he cannot afford the medication and has not gotten his insurance established here in [place].  Patient notes he was given a card which she has in hand for a free 30-day apply of his Eliquis but has not gone to the pharmacy to get it.  He notes that he smokes cigarettes, smokes marijuana, occasionally uses cocaine, and drinks alcohol.  Patient notes familial history of DVT and PEs.   Past Medical History:  Diagnosis Date  . Bipolar 1 disorder (HCC)   . DVT (deep vein thrombosis) in pregnancy   . Hypertension     There are no active problems to display for this patient.   No past surgical history on file.      Home Medications    Prior to Admission medications   Medication Sig Start Date End Date Taking? Authorizing Provider  apixaban (ELIQUIS) 5 MG TABS tablet Take 1 tablet (5 mg total) by mouth 2 (two) times daily. Patient not taking: Reported on 06/21/2018 04/25/18   Dietrich Pates, PA-C    Family History No family history on file.  Social History Social History   Tobacco Use  . Smoking status: Current Every Day Smoker    Packs/day: 1.00    Types: Cigarettes  . Smokeless  tobacco: Never Used  Substance Use Topics  . Alcohol use: Yes    Comment: occ  . Drug use: Yes    Types: Marijuana, Cocaine     Allergies   Patient has no known allergies.   Review of Systems Review of Systems  All other systems reviewed and are negative.    Physical Exam Updated Vital Signs BP (!) 139/95 (BP Location: Right Arm)   Pulse 69   Temp 98.3 F (36.8 C) (Oral)   Resp 16   Ht 6\' 3"  (1.905 m)   Wt 86.2 kg   SpO2 100%   BMI 23.75 kg/m   Physical Exam  Constitutional: He is oriented to person, place, and time. He appears well-developed and well-nourished.  HENT:  Head: Normocephalic and atraumatic.  Eyes: Pupils are equal, round, and reactive to light. Conjunctivae are normal. Right eye exhibits no discharge. Left eye exhibits no discharge. No scleral icterus.  Neck: Normal range of motion. No JVD present. No tracheal deviation present.  Cardiovascular: Normal rate, regular rhythm, normal heart sounds and intact distal pulses. Exam reveals no gallop and no friction rub.  No murmur heard. Pulmonary/Chest: Effort normal and breath sounds normal. No stridor. No respiratory distress. He has no wheezes. He has no rales. He exhibits tenderness.  TTP of left anterior chest wall   Musculoskeletal: He  exhibits no edema.  Neurological: He is alert and oriented to person, place, and time. Coordination normal.  Psychiatric: He has a normal mood and affect. His behavior is normal. Judgment and thought content normal.  Nursing note and vitals reviewed.    ED Treatments / Results  Labs (all labs ordered are listed, but only abnormal results are displayed) Labs Reviewed  CBC WITH DIFFERENTIAL/PLATELET - Abnormal; Notable for the following components:      Result Value   RBC 4.17 (*)    MCV 105.0 (*)    MCH 35.0 (*)    All other components within normal limits  COMPREHENSIVE METABOLIC PANEL - Abnormal; Notable for the following components:   Total Bilirubin 1.5 (*)      All other components within normal limits  D-DIMER, QUANTITATIVE (NOT AT [facility])  PROTIME-INR  I-STAT TROPONIN, ED    EKG None  Radiology Dg Chest 2 View  Result Date: 07/27/2018 CLINICAL DATA:  c/o shortness of breath and left side chest pain. Symptoms started last night and are worse today. EXAM: CHEST - 2 VIEW COMPARISON:  06/21/2018 FINDINGS: The heart size and mediastinal contours are within normal limits. Both lungs are clear. No pleural effusion or pneumothorax. The visualized skeletal structures are unremarkable. IMPRESSION: No active cardiopulmonary disease. Electronically Signed   By: Amie Portland M.D.   On: 07/27/2018 15:19    Procedures Procedures (including critical care time)  Medications Ordered in ED Medications - No data to display   Initial Impression / Assessment and Plan / ED Course  I have reviewed the triage vital signs and the nursing notes.  Pertinent labs & imaging results that were available during my care of the patient were reviewed by me and considered in my medical decision making (see chart for details).     Labs: I-STAT troponin, d-dimer, CBC CMP, PT/INR  Imaging: DG chest 2 view  Consults:  Therapeutics:  Discharge Meds:   Assessment/Plan: 37 year old male presents today with complaints of chest pain.  Patient is a very poor historian, he describes an intermittent sharp pain in the left anterior chest wall.  This is reproducible on my exam.  He has no signs of ACS, PE, dissection or any other life-threatening etiology presently.  He has reassuring work-up with reassuring EKG, normal troponin negative d-dimer.  Patient does have the 30-day trial card for Eliquis he is encouraged to take this to the pharmacy take medication as directed return immediately if develops any new or worsening signs or symptoms.  Patient verbalized understanding and agreement to today's plan had no further questions or concerns.   Final Clinical Impressions(s) / ED  Diagnoses   Final diagnoses:  Chest wall pain    ED Discharge Orders    None       Rosalio Loud 07/27/18 1700    Virgina Norfolk, DO 07/27/18 2331

## 2018-07-27 NOTE — ED Provider Notes (Signed)
Patient placed in Quick Look pathway, seen and evaluated   Chief Complaint: shortness of breath  HPI:  Donald Wilson is a 37 y.o. male with hx of DVT and out of his Eliquis x 1 year here with c/o shortness of breath and left side chest pain. Symptoms started last night and are worse today.   ROS: Resp: shortness of breath   Physical Exam:  BP (!) 139/93 (BP Location: Right Arm)   Pulse 70   Temp 98.3 F (36.8 C) (Oral)   Resp 16   Ht 6\' 3"  (1.905 m)   Wt 86.2 kg   SpO2 100%   BMI 23.75 kg/m    Gen: No distress  Neuro: Awake and Alert  Skin: Lungs no wheezing or rales heard  Heart: regular rate and rhythm  Initiation of care has begun. The patient has been counseled on the process, plan, and necessity for staying for the completion/evaluation, and the remainder of the medical screening examination    Janne Napoleon, NP 07/27/18 1355    Gerhard Munch, MD 07/27/18 1616

## 2018-07-27 NOTE — ED Triage Notes (Addendum)
Started last night got worse this am , cough a little has a hx of dvt he states  Sob when he walks  States on his feet at work, states ran out of meds for  DVT Eliquis x 1 year

## 2018-07-27 NOTE — Discharge Instructions (Addendum)
Please read attached information. If you experience any new or worsening signs or symptoms please return to the emergency room for evaluation. Please follow-up with your primary care provider or specialist as discussed.  Please use previously prescribed medication as directed. °

## 2018-07-27 NOTE — ED Notes (Signed)
Patient verbalizes understanding of discharge instructions. Opportunity for questioning and answers were provided. Armband removed by staff, pt discharged from ED ambulatory.   

## 2021-01-30 ENCOUNTER — Emergency Department (HOSPITAL_COMMUNITY): Payer: Medicaid Other | Admitting: Anesthesiology

## 2021-01-30 ENCOUNTER — Inpatient Hospital Stay (HOSPITAL_COMMUNITY)
Admission: EM | Admit: 2021-01-30 | Discharge: 2021-02-24 | DRG: 907 | Disposition: E | Payer: Medicaid Other | Attending: Cardiothoracic Surgery | Admitting: Cardiothoracic Surgery

## 2021-01-30 ENCOUNTER — Emergency Department (HOSPITAL_COMMUNITY): Payer: Medicaid Other

## 2021-01-30 ENCOUNTER — Encounter (HOSPITAL_COMMUNITY): Admission: EM | Disposition: E | Payer: Self-pay | Source: Home / Self Care

## 2021-01-30 DIAGNOSIS — J810 Acute pulmonary edema: Secondary | ICD-10-CM | POA: Diagnosis present

## 2021-01-30 DIAGNOSIS — T1490XA Injury, unspecified, initial encounter: Secondary | ICD-10-CM

## 2021-01-30 DIAGNOSIS — Z20822 Contact with and (suspected) exposure to covid-19: Secondary | ICD-10-CM | POA: Diagnosis present

## 2021-01-30 DIAGNOSIS — T148XXA Other injury of unspecified body region, initial encounter: Secondary | ICD-10-CM | POA: Diagnosis present

## 2021-01-30 DIAGNOSIS — R0902 Hypoxemia: Secondary | ICD-10-CM | POA: Diagnosis present

## 2021-01-30 DIAGNOSIS — S21312A Laceration without foreign body of left front wall of thorax with penetration into thoracic cavity, initial encounter: Principal | ICD-10-CM | POA: Diagnosis present

## 2021-01-30 DIAGNOSIS — E872 Acidosis: Secondary | ICD-10-CM | POA: Diagnosis present

## 2021-01-30 DIAGNOSIS — S41112A Laceration without foreign body of left upper arm, initial encounter: Secondary | ICD-10-CM | POA: Diagnosis present

## 2021-01-30 DIAGNOSIS — R404 Transient alteration of awareness: Secondary | ICD-10-CM | POA: Diagnosis present

## 2021-01-30 DIAGNOSIS — I469 Cardiac arrest, cause unspecified: Secondary | ICD-10-CM | POA: Diagnosis present

## 2021-01-30 DIAGNOSIS — R001 Bradycardia, unspecified: Secondary | ICD-10-CM | POA: Diagnosis present

## 2021-01-30 DIAGNOSIS — W269XXA Contact with unspecified sharp object(s), initial encounter: Secondary | ICD-10-CM

## 2021-01-30 DIAGNOSIS — S21112A Laceration without foreign body of left front wall of thorax without penetration into thoracic cavity, initial encounter: Secondary | ICD-10-CM | POA: Diagnosis present

## 2021-01-30 DIAGNOSIS — S2609XA Other injury of heart with hemopericardium, initial encounter: Secondary | ICD-10-CM | POA: Diagnosis present

## 2021-01-30 HISTORY — PX: THOROCOTOMY WITH LOBECTOMY: SHX6118

## 2021-01-30 HISTORY — PX: STERNOTOMY: SHX1057

## 2021-01-30 LAB — I-STAT ARTERIAL BLOOD GAS, ED
Acid-base deficit: 20 mmol/L — ABNORMAL HIGH (ref 0.0–2.0)
Bicarbonate: 10.7 mmol/L — ABNORMAL LOW (ref 20.0–28.0)
Calcium, Ion: 0.88 mmol/L — CL (ref 1.15–1.40)
HCT: 24 % — ABNORMAL LOW (ref 39.0–52.0)
Hemoglobin: 8.2 g/dL — ABNORMAL LOW (ref 13.0–17.0)
O2 Saturation: 99 %
Potassium: 5 mmol/L (ref 3.5–5.1)
Sodium: 139 mmol/L (ref 135–145)
TCO2: 12 mmol/L — ABNORMAL LOW (ref 22–32)
pCO2 arterial: 45.2 mmHg (ref 32.0–48.0)
pH, Arterial: 6.984 — CL (ref 7.350–7.450)
pO2, Arterial: 216 mmHg — ABNORMAL HIGH (ref 83.0–108.0)

## 2021-01-30 LAB — I-STAT CHEM 8, ED
BUN: 10 mg/dL (ref 6–20)
Calcium, Ion: 1.03 mmol/L — ABNORMAL LOW (ref 1.15–1.40)
Chloride: 112 mmol/L — ABNORMAL HIGH (ref 98–111)
Creatinine, Ser: 1.7 mg/dL — ABNORMAL HIGH (ref 0.61–1.24)
Glucose, Bld: 224 mg/dL — ABNORMAL HIGH (ref 70–99)
HCT: 37 % — ABNORMAL LOW (ref 39.0–52.0)
Hemoglobin: 12.6 g/dL — ABNORMAL LOW (ref 13.0–17.0)
Potassium: 5.5 mmol/L — ABNORMAL HIGH (ref 3.5–5.1)
Sodium: 143 mmol/L (ref 135–145)
TCO2: 9 mmol/L — ABNORMAL LOW (ref 22–32)

## 2021-01-30 LAB — RESP PANEL BY RT-PCR (FLU A&B, COVID) ARPGX2
Influenza A by PCR: NEGATIVE
Influenza B by PCR: NEGATIVE
SARS Coronavirus 2 by RT PCR: NEGATIVE

## 2021-01-30 SURGERY — THORACOTOMY, WITH LOBECTOMY
Anesthesia: General

## 2021-01-30 MED ORDER — EPINEPHRINE 0.1 MG/10ML (10 MCG/ML) SYRINGE FOR IV PUSH (FOR BLOOD PRESSURE SUPPORT)
40.0000 ug | PREFILLED_SYRINGE | Freq: Once | INTRAVENOUS | Status: AC
  Administered 2021-01-30: 40 ug via INTRAVENOUS

## 2021-01-30 MED ORDER — SODIUM BICARBONATE 8.4 % IV SOLN
INTRAVENOUS | Status: AC | PRN
  Administered 2021-01-30 (×2): 50 meq via INTRAVENOUS

## 2021-01-30 MED ORDER — EPINEPHRINE 1 MG/10ML IJ SOSY
PREFILLED_SYRINGE | INTRAMUSCULAR | Status: AC | PRN
  Administered 2021-01-30 (×4): 1 mg via INTRAVENOUS
  Administered 2021-01-30: 40 via INTRAVENOUS
  Administered 2021-01-30 (×8): 1 mg via INTRAVENOUS

## 2021-01-30 MED ORDER — SODIUM BICARBONATE 8.4 % IV SOLN
INTRAVENOUS | Status: DC | PRN
  Administered 2021-01-30: 50 meq via INTRAVENOUS

## 2021-01-30 MED ORDER — 0.9 % SODIUM CHLORIDE (POUR BTL) OPTIME
TOPICAL | Status: DC | PRN
  Administered 2021-01-30: 4000 mL
  Administered 2021-01-31: 1000 mL
  Administered 2021-01-31: 2000 mL

## 2021-01-30 MED ORDER — EPINEPHRINE 1 MG/10ML IJ SOSY
PREFILLED_SYRINGE | INTRAMUSCULAR | Status: DC | PRN
  Administered 2021-01-30 (×2): 1 mg via INTRAVENOUS
  Administered 2021-01-31: .5 mg via INTRAVENOUS

## 2021-01-30 MED ORDER — DEXTROSE 50 % IV SOLN
INTRAVENOUS | Status: AC | PRN
  Administered 2021-01-30: 1 via INTRAVENOUS

## 2021-01-30 MED ORDER — LACTATED RINGERS IV SOLN: INTRAVENOUS | Status: DC | PRN

## 2021-01-30 MED ORDER — HEPARIN SODIUM (PORCINE) 1000 UNIT/ML IJ SOLN
INTRAMUSCULAR | Status: DC | PRN
  Administered 2021-01-30: 30000 [IU] via INTRAVENOUS

## 2021-01-30 MED ORDER — EPINEPHRINE HCL 5 MG/250ML IV SOLN IN NS
0.5000 ug/min | INTRAVENOUS | Status: DC
Start: 2021-01-30 — End: 2021-01-31
  Administered 2021-01-30: 80 ug/min via INTRAVENOUS
  Administered 2021-01-30: 40 ug/min via INTRAVENOUS
  Filled 2021-01-30: qty 250

## 2021-01-30 MED ORDER — IOHEXOL 300 MG/ML  SOLN
100.0000 mL | Freq: Once | INTRAMUSCULAR | Status: AC | PRN
  Administered 2021-01-30: 100 mL via INTRAVENOUS

## 2021-01-30 MED ORDER — SODIUM CHLORIDE 0.9 % IV SOLN
1.0000 g | Freq: Once | INTRAVENOUS | Status: AC
  Administered 2021-01-30: 1 g via INTRAVENOUS

## 2021-01-30 MED ORDER — CALCIUM CHLORIDE 10 % IV SOLN
INTRAVENOUS | Status: DC | PRN
  Administered 2021-01-30: 1 g via INTRAVENOUS
  Administered 2021-01-31: .5 g via INTRAVENOUS

## 2021-01-30 SURGICAL SUPPLY — 98 items
BAG DECANTER FOR FLEXI CONT (MISCELLANEOUS) ×2 IMPLANT
BIT DRILL 7/64X5 DISP (BIT) IMPLANT
BLADE CLIPPER SURG (BLADE) IMPLANT
CABLE PACING FASLOC BIEGE (MISCELLANEOUS) ×2 IMPLANT
CABLE PACING FASLOC BLUE (MISCELLANEOUS) ×2 IMPLANT
CANISTER SUCT 3000ML PPV (MISCELLANEOUS) ×4 IMPLANT
CANNULA NON VENT 20FR 12 (CANNULA) ×2 IMPLANT
CATH CPB KIT HENDRICKSON (MISCELLANEOUS) ×2 IMPLANT
CATH ROBINSON RED A/P 18FR (CATHETERS) ×6 IMPLANT
CLIP VESOCCLUDE MED 6/CT (CLIP) ×2 IMPLANT
CLIP VESOCCLUDE SM WIDE 6/CT (CLIP) ×2 IMPLANT
CNTNR URN SCR LID CUP LEK RST (MISCELLANEOUS) ×1 IMPLANT
CONN ST 1/4X3/8  BEN (MISCELLANEOUS)
CONN ST 1/4X3/8 BEN (MISCELLANEOUS) IMPLANT
CONN Y 3/8X3/8X3/8  BEN (MISCELLANEOUS)
CONN Y 3/8X3/8X3/8 BEN (MISCELLANEOUS) IMPLANT
CONT SPEC 4OZ STRL OR WHT (MISCELLANEOUS) ×1
COUNTER NEEDLE 20 DBL MAG RED (NEEDLE) ×2 IMPLANT
COVER PROBE W GEL 5X96 (DRAPES) ×2 IMPLANT
COVER SURGICAL LIGHT HANDLE (MISCELLANEOUS) ×2 IMPLANT
DERMABOND ADVANCED (GAUZE/BANDAGES/DRESSINGS)
DERMABOND ADVANCED .7 DNX12 (GAUZE/BANDAGES/DRESSINGS) IMPLANT
DRAIN CHANNEL 28F RND 3/8 FF (WOUND CARE) IMPLANT
DRAPE LAPAROSCOPIC ABDOMINAL (DRAPES) IMPLANT
ELECT BLADE 6.5 EXT (BLADE) ×2 IMPLANT
ELECT REM PT RETURN 9FT ADLT (ELECTROSURGICAL) ×2
ELECTRODE REM PT RTRN 9FT ADLT (ELECTROSURGICAL) ×1 IMPLANT
FELT TEFLON 1X6 (MISCELLANEOUS) ×4 IMPLANT
GAUZE SPONGE 4X4 12PLY STRL (GAUZE/BANDAGES/DRESSINGS) IMPLANT
GLOVE NEODERM STRL 7.5 LF PF (GLOVE) ×2 IMPLANT
GLOVE SURG NEODERM 7.5  LF PF (GLOVE) ×2
GOWN STRL REUS W/ TWL LRG LVL3 (GOWN DISPOSABLE) ×3 IMPLANT
GOWN STRL REUS W/TWL LRG LVL3 (GOWN DISPOSABLE) ×3
HANDLE STAPLE ENDO GIA SHORT (STAPLE) ×1
HEMOSTAT SURGICEL 2X14 (HEMOSTASIS) IMPLANT
INSERT FOGARTY XLG (MISCELLANEOUS) ×2 IMPLANT
KIT BASIN OR (CUSTOM PROCEDURE TRAY) ×2 IMPLANT
KIT SUCTION CATH 14FR (SUCTIONS) ×2 IMPLANT
KIT TURNOVER KIT B (KITS) ×2 IMPLANT
NS IRRIG 1000ML POUR BTL (IV SOLUTION) ×6 IMPLANT
PACK CHEST (CUSTOM PROCEDURE TRAY) ×2 IMPLANT
PAD ARMBOARD 7.5X6 YLW CONV (MISCELLANEOUS) ×4 IMPLANT
POWDER SURGICEL 3.0 GRAM (HEMOSTASIS) ×2 IMPLANT
PUMP SARN DELFIN (MISCELLANEOUS) ×2 IMPLANT
RELOAD EGIA 45 MED/THCK PURPLE (STAPLE) ×4 IMPLANT
SEALANT PROGEL (MISCELLANEOUS) IMPLANT
SEALANT SURG COSEAL 4ML (VASCULAR PRODUCTS) IMPLANT
SEALANT SURG COSEAL 8ML (VASCULAR PRODUCTS) ×2 IMPLANT
SET CARDIOPLEGIA MPS 5001102 (MISCELLANEOUS) ×2 IMPLANT
SHEARS HARMONIC HDI 20CM (ELECTROSURGICAL) IMPLANT
SHEATH PINNACLE 8F 10CM (SHEATH) ×2 IMPLANT
SPONGE INTESTINAL PEANUT (DISPOSABLE) IMPLANT
SPONGE LAP 18X18 RF (DISPOSABLE) ×4 IMPLANT
SPONGE TONSIL TAPE 1 RFD (DISPOSABLE) ×2 IMPLANT
STAPLER ENDO GIA 12MM SHORT (STAPLE) ×1 IMPLANT
STAPLER VISISTAT 35W (STAPLE) ×2 IMPLANT
STOPCOCK 4 WAY LG BORE MALE ST (IV SETS) ×2 IMPLANT
SUT ETHIBOND X763 2 0 SH 1 (SUTURE) ×6 IMPLANT
SUT MNCRL AB 4-0 PS2 18 (SUTURE) ×4 IMPLANT
SUT PDS AB 1 CTX 36 (SUTURE) ×4 IMPLANT
SUT PROLENE 3 0 SH DA (SUTURE) ×14 IMPLANT
SUT PROLENE 4 0 RB 1 (SUTURE) ×1
SUT PROLENE 4 0 SH DA (SUTURE) ×14 IMPLANT
SUT PROLENE 4-0 RB1 .5 CRCL 36 (SUTURE) ×1 IMPLANT
SUT SILK  1 MH (SUTURE) ×2
SUT SILK 1 MH (SUTURE) ×2 IMPLANT
SUT SILK 1 TIES 10X30 (SUTURE) ×2 IMPLANT
SUT SILK 2 0 SH (SUTURE) IMPLANT
SUT SILK 2 0SH CR/8 30 (SUTURE) IMPLANT
SUT SILK 3 0 SH 30 (SUTURE) IMPLANT
SUT SILK 3 0SH CR/8 30 (SUTURE) ×2 IMPLANT
SUT STEEL 6MS V (SUTURE) ×2 IMPLANT
SUT STEEL SZ 6 DBL 3X14 BALL (SUTURE) ×2 IMPLANT
SUT VIC AB 0 CT1 27 (SUTURE)
SUT VIC AB 0 CT1 27XBRD ANBCTR (SUTURE) IMPLANT
SUT VIC AB 0 CTX 27 (SUTURE) IMPLANT
SUT VIC AB 1 CTX 27 (SUTURE) ×2 IMPLANT
SUT VIC AB 2 TP1 27 (SUTURE) ×2 IMPLANT
SUT VIC AB 2-0 CT1 27 (SUTURE)
SUT VIC AB 2-0 CT1 TAPERPNT 27 (SUTURE) IMPLANT
SUT VIC AB 2-0 CTX 27 (SUTURE) ×4 IMPLANT
SUT VIC AB 2-0 CTX 36 (SUTURE) ×2 IMPLANT
SUT VIC AB 3-0 MH 27 (SUTURE) IMPLANT
SUT VIC AB 3-0 SH 27 (SUTURE)
SUT VIC AB 3-0 SH 27X BRD (SUTURE) IMPLANT
SUT VIC AB 3-0 X1 27 (SUTURE) ×2 IMPLANT
SUT VICRYL 0 UR6 27IN ABS (SUTURE) IMPLANT
SUT VICRYL 2 TP 1 (SUTURE) ×4 IMPLANT
SYR 10ML KIT SKIN ADHESIVE (MISCELLANEOUS) ×2 IMPLANT
SYR 20ML LL LF (SYRINGE) ×2 IMPLANT
SYR 30ML LL (SYRINGE) ×2 IMPLANT
SYSTEM SAHARA CHEST DRAIN ATS (WOUND CARE) ×2 IMPLANT
TIP APPLICATOR SPRAY EXTEND 16 (VASCULAR PRODUCTS) IMPLANT
TOWEL GREEN STERILE (TOWEL DISPOSABLE) ×4 IMPLANT
TRAY CATH LUMEN 1 20CM STRL (SET/KITS/TRAYS/PACK) ×2 IMPLANT
TRAY FOLEY MTR SLVR 16FR STAT (SET/KITS/TRAYS/PACK) ×2 IMPLANT
TUBING ART PRESS 48 MALE/FEM (TUBING) ×4 IMPLANT
WATER STERILE IRR 1000ML POUR (IV SOLUTION) ×4 IMPLANT

## 2021-01-30 NOTE — ED Notes (Signed)
5th PRBC MTP infusing.

## 2021-01-30 NOTE — ED Notes (Signed)
2nd MTP unit PRBC infusing.

## 2021-01-30 NOTE — ED Notes (Signed)
EDP at bedside inserting central line .

## 2021-01-30 NOTE — ED Notes (Signed)
Paged Cardiology by Okey Dupre

## 2021-01-30 NOTE — ED Notes (Signed)
Pulses present  

## 2021-01-30 NOTE — ED Notes (Signed)
2nd unit PRBC started.

## 2021-01-30 NOTE — ED Notes (Signed)
Returned from CT scan , pulses present , ETT/OGT intact , IV sites and chest tubes intact.

## 2021-01-30 NOTE — ED Provider Notes (Signed)
CHEST TUBE INSERTION  Date/Time: 02/19/2021 10:36 PM Performed by: Gailen Shelter, PA Authorized by: Gailen Shelter, PA   Consent:    Consent obtained:  Verbal   Consent given by:  Patient   Risks, benefits, and alternatives were discussed: not applicable     Risks, benefits, and alternatives were discussed comment:  Emergent Universal protocol:    Patient identity confirmed:  Arm band Pre-procedure details:    Skin preparation:  Chlorhexidine   Preparation: Patient was prepped and draped in the usual sterile fashion   Sedation:    Sedation type:  Deep Anesthesia:    Anesthesia method:  None Procedure details:    Placement location:  R lateral   Scalpel size:  11   Tube size ([place]): 14.   Ultrasound guidance: no     Tension pneumothorax: no     Tube connected to:  Suction and water seal   Drainage characteristics:  Bloody   Suture material:  2-0 silk   Dressing:  4x4 sterile gauze Post-procedure details:    Procedure completion:  Tolerated well, no immediate complications   Emergent right-sided pigtail chest tube placed.   Patient was in deep sedation from intubation.   Donald Wilson, [place] 02/20/2021 2342    Maia Plan, MD 02/23/2021 [phone]

## 2021-01-30 NOTE — Anesthesia Preprocedure Evaluation (Addendum)
Anesthesia Evaluation  Patient identified by MRN, date of birth, ID band Patient unresponsive  General Assessment Comment:CPR on ED, Chest cracked in ED, balloon catheter in heart Dr. Zadie Cleverly documentation limited or incomplete due to emergent nature of procedure.  Airway Mallampati: Intubated       Dental   Pulmonary       + intubated    Cardiovascular  Rhythm:Regular Rate:Abnormal     Neuro/Psych    GI/Hepatic   Endo/Other    Renal/GU      Musculoskeletal   Abdominal   Peds  Hematology   Anesthesia Other Findings   Reproductive/Obstetrics                             Anesthesia Physical Anesthesia Plan  ASA: IV and emergent  Anesthesia Plan: General   Post-op Pain Management:    Induction: Intravenous  PONV Risk Score and Plan: Treatment may vary due to age or medical condition  Airway Management Planned: Oral ETT  Additional Equipment:   Intra-op Plan:   Post-operative Plan: Post-operative intubation/ventilation  Informed Consent:   Plan Discussed with: Anesthesiologist, CRNA and Surgeon  Anesthesia Plan Comments:         Anesthesia Quick Evaluation

## 2021-01-30 NOTE — ED Notes (Signed)
Weak pulses present .
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
## 2021-01-30 NOTE — ED Notes (Signed)
2nd FFP started

## 2021-01-30 NOTE — ED Notes (Signed)
Chest compressions continues, 1st unit FFP started.

## 2021-01-30 NOTE — ED Notes (Addendum)
Patient did not go to CT scan , weak thready pulses.

## 2021-01-30 NOTE — ED Notes (Signed)
CPR restarted.
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
## 2021-01-30 NOTE — ED Notes (Signed)
CPR continues.  

## 2021-01-30 NOTE — ED Notes (Signed)
1st unit PRBC MTP.

## 2021-01-30 NOTE — ED Notes (Signed)
1st unit PRBC started
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
## 2021-01-30 NOTE — ED Notes (Signed)
No pulse , chest compressions continues.
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
## 2021-01-30 NOTE — ED Notes (Signed)
3rd Mtp PRBC infusing.

## 2021-01-30 NOTE — ED Notes (Signed)
Thoracic surgeon at bedside performing thoracotomy.

## 2021-01-30 NOTE — ED Notes (Addendum)
Chest compressions restarted . 1st MTP unit FFP infusing

## 2021-01-30 NOTE — ED Notes (Signed)
Pulses present , transported to CT scan.
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
## 2021-01-30 NOTE — ED Notes (Signed)
3rd MTP FFP infusing .

## 2021-01-30 NOTE — ED Notes (Signed)
7th and 8th FFP completed

## 2021-01-30 NOTE — ED Notes (Signed)
Mass Transfusion Protocol initiated.

## 2021-01-30 NOTE — ED Notes (Signed)
Torniquet removed at left upper arm , no profuse bleeding .

## 2021-01-30 NOTE — ED Notes (Signed)
Chest compressions restarted

## 2021-01-30 NOTE — ED Notes (Addendum)
Pulses present . Bilateral chest tube being inserted by trauma MD and EDP. Unable to collect blood specimen at this time .

## 2021-01-30 NOTE — ED Notes (Signed)
4th FFP unit MTP infusing.
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
## 2021-01-30 NOTE — ED Notes (Signed)
CT notified that patient is coming.

## 2021-01-30 NOTE — ED Notes (Signed)
4th MTP PRBC infusing.

## 2021-01-31 ENCOUNTER — Encounter (HOSPITAL_COMMUNITY): Payer: Self-pay | Admitting: Cardiothoracic Surgery

## 2021-01-31 ENCOUNTER — Inpatient Hospital Stay (HOSPITAL_COMMUNITY): Payer: Medicaid Other

## 2021-01-31 DIAGNOSIS — E872 Acidosis: Secondary | ICD-10-CM | POA: Diagnosis present

## 2021-01-31 DIAGNOSIS — R0902 Hypoxemia: Secondary | ICD-10-CM | POA: Diagnosis present

## 2021-01-31 DIAGNOSIS — S2609XA Other injury of heart with hemopericardium, initial encounter: Secondary | ICD-10-CM | POA: Diagnosis present

## 2021-01-31 DIAGNOSIS — S41112A Laceration without foreign body of left upper arm, initial encounter: Secondary | ICD-10-CM | POA: Diagnosis present

## 2021-01-31 DIAGNOSIS — R404 Transient alteration of awareness: Secondary | ICD-10-CM | POA: Diagnosis present

## 2021-01-31 DIAGNOSIS — S21112A Laceration without foreign body of left front wall of thorax without penetration into thoracic cavity, initial encounter: Secondary | ICD-10-CM | POA: Diagnosis present

## 2021-01-31 DIAGNOSIS — S21312A Laceration without foreign body of left front wall of thorax with penetration into thoracic cavity, initial encounter: Secondary | ICD-10-CM | POA: Diagnosis present

## 2021-01-31 DIAGNOSIS — S2692XA Laceration of heart, unspecified with or without hemopericardium, initial encounter: Secondary | ICD-10-CM | POA: Diagnosis not present

## 2021-01-31 DIAGNOSIS — R001 Bradycardia, unspecified: Secondary | ICD-10-CM | POA: Diagnosis present

## 2021-01-31 DIAGNOSIS — W269XXA Contact with unspecified sharp object(s), initial encounter: Secondary | ICD-10-CM | POA: Diagnosis not present

## 2021-01-31 DIAGNOSIS — I469 Cardiac arrest, cause unspecified: Secondary | ICD-10-CM | POA: Diagnosis present

## 2021-01-31 DIAGNOSIS — J810 Acute pulmonary edema: Secondary | ICD-10-CM | POA: Diagnosis present

## 2021-01-31 DIAGNOSIS — Z20822 Contact with and (suspected) exposure to covid-19: Secondary | ICD-10-CM | POA: Diagnosis present

## 2021-01-31 DIAGNOSIS — T148XXA Other injury of unspecified body region, initial encounter: Secondary | ICD-10-CM | POA: Diagnosis present

## 2021-01-31 LAB — PREPARE FRESH FROZEN PLASMA
Unit division: 0
Unit division: 0
Unit division: 0
Unit division: 0
Unit division: 0
Unit division: 0
Unit division: 0
Unit division: 0
Unit division: 0
Unit division: 0
Unit division: 0
Unit division: 0
Unit division: 0
Unit division: 0
Unit division: 0
Unit division: 0
Unit division: 0
Unit division: 0
Unit division: 0

## 2021-01-31 LAB — PREPARE CRYOPRECIPITATE: Unit division: 0

## 2021-01-31 LAB — POCT I-STAT 7, (LYTES, BLD GAS, ICA,H+H)
Acid-base deficit: 15 mmol/L — ABNORMAL HIGH (ref 0.0–2.0)
Acid-base deficit: 16 mmol/L — ABNORMAL HIGH (ref 0.0–2.0)
Acid-base deficit: 19 mmol/L — ABNORMAL HIGH (ref 0.0–2.0)
Acid-base deficit: 21 mmol/L — ABNORMAL HIGH (ref 0.0–2.0)
Acid-base deficit: 3 mmol/L — ABNORMAL HIGH (ref 0.0–2.0)
Bicarbonate: 10.8 mmol/L — ABNORMAL LOW (ref 20.0–28.0)
Bicarbonate: 11.1 mmol/L — ABNORMAL LOW (ref 20.0–28.0)
Bicarbonate: 12.9 mmol/L — ABNORMAL LOW (ref 20.0–28.0)
Bicarbonate: 16.6 mmol/L — ABNORMAL LOW (ref 20.0–28.0)
Bicarbonate: 22.2 mmol/L (ref 20.0–28.0)
Calcium, Ion: 0.4 mmol/L — CL (ref 1.15–1.40)
Calcium, Ion: 0.62 mmol/L — CL (ref 1.15–1.40)
Calcium, Ion: 0.72 mmol/L — CL (ref 1.15–1.40)
Calcium, Ion: 0.98 mmol/L — ABNORMAL LOW (ref 1.15–1.40)
Calcium, Ion: <0.3 mmol/L — CL (ref 1.15–1.40)
HCT: 24 % — ABNORMAL LOW (ref 39.0–52.0)
HCT: 26 % — ABNORMAL LOW (ref 39.0–52.0)
HCT: 26 % — ABNORMAL LOW (ref 39.0–52.0)
HCT: 27 % — ABNORMAL LOW (ref 39.0–52.0)
HCT: 27 % — ABNORMAL LOW (ref 39.0–52.0)
Hemoglobin: 8.2 g/dL — ABNORMAL LOW (ref 13.0–17.0)
Hemoglobin: 8.8 g/dL — ABNORMAL LOW (ref 13.0–17.0)
Hemoglobin: 8.8 g/dL — ABNORMAL LOW (ref 13.0–17.0)
Hemoglobin: 9.2 g/dL — ABNORMAL LOW (ref 13.0–17.0)
Hemoglobin: 9.2 g/dL — ABNORMAL LOW (ref 13.0–17.0)
O2 Saturation: 100 %
O2 Saturation: 100 %
O2 Saturation: 100 %
O2 Saturation: 100 %
O2 Saturation: 30 %
Potassium: 3.7 mmol/L (ref 3.5–5.1)
Potassium: 4 mmol/L (ref 3.5–5.1)
Potassium: 6 mmol/L — ABNORMAL HIGH (ref 3.5–5.1)
Potassium: 6.5 mmol/L (ref 3.5–5.1)
Potassium: 8.3 mmol/L (ref 3.5–5.1)
Sodium: 141 mmol/L (ref 135–145)
Sodium: 143 mmol/L (ref 135–145)
Sodium: 145 mmol/L (ref 135–145)
Sodium: 151 mmol/L — ABNORMAL HIGH (ref 135–145)
Sodium: 152 mmol/L — ABNORMAL HIGH (ref 135–145)
TCO2: 12 mmol/L — ABNORMAL LOW (ref 22–32)
TCO2: 13 mmol/L — ABNORMAL LOW (ref 22–32)
TCO2: 14 mmol/L — ABNORMAL LOW (ref 22–32)
TCO2: 19 mmol/L — ABNORMAL LOW (ref 22–32)
TCO2: 23 mmol/L (ref 22–32)
pCO2 arterial: 39 mmHg (ref 32.0–48.0)
pCO2 arterial: 40.9 mmHg (ref 32.0–48.0)
pCO2 arterial: 41.9 mmHg (ref 32.0–48.0)
pCO2 arterial: 57.8 mmHg — ABNORMAL HIGH (ref 32.0–48.0)
pCO2 arterial: 73.1 mmHg (ref 32.0–48.0)
pH, Arterial: 6.889 — CL (ref 7.350–7.450)
pH, Arterial: 6.964 — CL (ref 7.350–7.450)
pH, Arterial: 7.019 — CL (ref 7.350–7.450)
pH, Arterial: 7.107 — CL (ref 7.350–7.450)
pH, Arterial: 7.363 (ref 7.350–7.450)
pO2, Arterial: 30 mmHg — CL (ref 83.0–108.0)
pO2, Arterial: 319 mmHg — ABNORMAL HIGH (ref 83.0–108.0)
pO2, Arterial: 324 mmHg — ABNORMAL HIGH (ref 83.0–108.0)
pO2, Arterial: 380 mmHg — ABNORMAL HIGH (ref 83.0–108.0)
pO2, Arterial: 385 mmHg — ABNORMAL HIGH (ref 83.0–108.0)

## 2021-01-31 LAB — BPAM PLATELET PHERESIS
Blood Product Expiration Date: 202204102359
Blood Product Expiration Date: 202204102359
ISSUE DATE / TIME: 202204062255
ISSUE DATE / TIME: 202204071117
Unit Type and Rh: 6200
Unit Type and Rh: 6200

## 2021-01-31 LAB — BPAM FFP
Blood Product Expiration Date: 202204092359
Blood Product Expiration Date: 202204092359
Blood Product Expiration Date: 202204112359
Blood Product Expiration Date: 202204112359
Blood Product Expiration Date: 202204112359
Blood Product Expiration Date: 202204112359
Blood Product Expiration Date: 202204112359
Blood Product Expiration Date: 202204112359
Blood Product Expiration Date: 202204122359
Blood Product Expiration Date: 202204122359
Blood Product Expiration Date: 202204122359
Blood Product Expiration Date: 202204122359
Blood Product Expiration Date: 202204172359
Blood Product Expiration Date: 202204172359
Blood Product Expiration Date: 202204212359
Blood Product Expiration Date: 202204222359
Blood Product Expiration Date: 202204222359
Blood Product Expiration Date: 202204222359
Blood Product Expiration Date: 202204222359
Blood Product Expiration Date: 202204252359
Blood Product Expiration Date: 202204252359
Blood Product Expiration Date: 202204252359
ISSUE DATE / TIME: 202204062213
ISSUE DATE / TIME: 202204062241
ISSUE DATE / TIME: 202204062241
ISSUE DATE / TIME: 202204062245
ISSUE DATE / TIME: 202204062245
ISSUE DATE / TIME: 202204062245
ISSUE DATE / TIME: 202204062245
ISSUE DATE / TIME: 202204062245
ISSUE DATE / TIME: 202204062255
ISSUE DATE / TIME: 202204062255
ISSUE DATE / TIME: 202204062255
ISSUE DATE / TIME: 202204062255
ISSUE DATE / TIME: 202204062303
ISSUE DATE / TIME: 202204062303
ISSUE DATE / TIME: 202204062320
ISSUE DATE / TIME: 202204062320
ISSUE DATE / TIME: 202204062320
ISSUE DATE / TIME: 202204062320
ISSUE DATE / TIME: 202204070144
ISSUE DATE / TIME: 202204070144
ISSUE DATE / TIME: 202204070144
ISSUE DATE / TIME: 202204070144
Unit Type and Rh: 5100
Unit Type and Rh: 5100
Unit Type and Rh: 5100
Unit Type and Rh: 6200
Unit Type and Rh: 6200
Unit Type and Rh: 6200
Unit Type and Rh: 6200
Unit Type and Rh: 6200
Unit Type and Rh: 6200
Unit Type and Rh: 6200
Unit Type and Rh: 6200
Unit Type and Rh: 6200
Unit Type and Rh: 6200
Unit Type and Rh: 6200
Unit Type and Rh: 6200
Unit Type and Rh: 6200
Unit Type and Rh: 6200
Unit Type and Rh: 6200
Unit Type and Rh: 6200
Unit Type and Rh: 6200
Unit Type and Rh: 6200
Unit Type and Rh: 9500

## 2021-01-31 LAB — PREPARE PLATELET PHERESIS
Unit division: 0
Unit division: 0

## 2021-01-31 LAB — POCT I-STAT, CHEM 8
BUN: 10 mg/dL (ref 6–20)
BUN: 9 mg/dL (ref 6–20)
BUN: 9 mg/dL (ref 6–20)
Calcium, Ion: 0.38 mmol/L — CL (ref 1.15–1.40)
Calcium, Ion: 0.81 mmol/L — CL (ref 1.15–1.40)
Calcium, Ion: 0.99 mmol/L — ABNORMAL LOW (ref 1.15–1.40)
Chloride: 102 mmol/L (ref 98–111)
Chloride: 97 mmol/L — ABNORMAL LOW (ref 98–111)
Chloride: 97 mmol/L — ABNORMAL LOW (ref 98–111)
Creatinine, Ser: 1.4 mg/dL — ABNORMAL HIGH (ref 0.61–1.24)
Creatinine, Ser: 1.5 mg/dL — ABNORMAL HIGH (ref 0.61–1.24)
Creatinine, Ser: 1.5 mg/dL — ABNORMAL HIGH (ref 0.61–1.24)
Glucose, Bld: 218 mg/dL — ABNORMAL HIGH (ref 70–99)
Glucose, Bld: 277 mg/dL — ABNORMAL HIGH (ref 70–99)
Glucose, Bld: 331 mg/dL — ABNORMAL HIGH (ref 70–99)
HCT: 23 % — ABNORMAL LOW (ref 39.0–52.0)
HCT: 25 % — ABNORMAL LOW (ref 39.0–52.0)
HCT: 26 % — ABNORMAL LOW (ref 39.0–52.0)
Hemoglobin: 7.8 g/dL — ABNORMAL LOW (ref 13.0–17.0)
Hemoglobin: 8.5 g/dL — ABNORMAL LOW (ref 13.0–17.0)
Hemoglobin: 8.8 g/dL — ABNORMAL LOW (ref 13.0–17.0)
Potassium: 3.8 mmol/L (ref 3.5–5.1)
Potassium: 5.2 mmol/L — ABNORMAL HIGH (ref 3.5–5.1)
Potassium: 6.7 mmol/L (ref 3.5–5.1)
Sodium: 144 mmol/L (ref 135–145)
Sodium: 148 mmol/L — ABNORMAL HIGH (ref 135–145)
Sodium: 151 mmol/L — ABNORMAL HIGH (ref 135–145)
TCO2: 16 mmol/L — ABNORMAL LOW (ref 22–32)
TCO2: 20 mmol/L — ABNORMAL LOW (ref 22–32)
TCO2: 21 mmol/L — ABNORMAL LOW (ref 22–32)

## 2021-01-31 LAB — BPAM CRYOPRECIPITATE
Blood Product Expiration Date: 202204070450
ISSUE DATE / TIME: 202204062314
Unit Type and Rh: 5100

## 2021-01-31 LAB — ABO/RH: ABO/RH(D): O POS

## 2021-01-31 MED ORDER — ROCURONIUM BROMIDE 10 MG/ML (PF) SYRINGE
PREFILLED_SYRINGE | INTRAVENOUS | Status: DC | PRN
  Administered 2021-01-31 (×3): 50 mg via INTRAVENOUS

## 2021-01-31 MED ORDER — NOREPINEPHRINE 4 MG/250ML-% IV SOLN
INTRAVENOUS | Status: DC | PRN
  Administered 2021-01-31: 4 ug/min via INTRAVENOUS

## 2021-01-31 MED ORDER — HEMOSTATIC AGENTS (NO CHARGE) OPTIME
TOPICAL | Status: DC | PRN
  Administered 2021-01-31 (×3): 1 via TOPICAL

## 2021-01-31 MED ORDER — VANCOMYCIN HCL 1000 MG IV SOLR
INTRAVENOUS | Status: AC
  Filled 2021-01-31: qty 3000

## 2021-01-31 MED ORDER — VANCOMYCIN HCL 1000 MG IV SOLR
INTRAVENOUS | Status: DC | PRN
  Administered 2021-01-31: 3000 mg

## 2021-01-31 MED ORDER — INSULIN REGULAR(HUMAN) IN NACL 100-0.9 UT/100ML-% IV SOLN
INTRAVENOUS | Status: AC
  Administered 2021-01-31: 3.8 [IU]/h via INTRAVENOUS
  Filled 2021-01-31: qty 100

## 2021-01-31 MED ORDER — SODIUM CHLORIDE 0.9 % IV SOLN: INTRAVENOUS | Status: DC | PRN

## 2021-01-31 MED ORDER — CEFAZOLIN SODIUM-DEXTROSE 2-3 GM-%(50ML) IV SOLR
INTRAVENOUS | Status: DC | PRN
  Administered 2021-01-31: 2 g via INTRAVENOUS

## 2021-01-31 MED ORDER — MIDAZOLAM HCL 2 MG/2ML IJ SOLN
INTRAMUSCULAR | Status: AC
  Filled 2021-01-31: qty 2

## 2021-01-31 MED ORDER — SODIUM CHLORIDE 0.9 % IV SOLN
INTRAVENOUS | Status: AC
  Filled 2021-01-31: qty 1.2

## 2021-01-31 MED ORDER — MIDAZOLAM HCL 5 MG/5ML IJ SOLN
INTRAMUSCULAR | Status: DC | PRN
  Administered 2021-01-31: 2 mg via INTRAVENOUS

## 2021-01-31 MED ORDER — PROTAMINE SULFATE 10 MG/ML IV SOLN
INTRAVENOUS | Status: DC | PRN
  Administered 2021-01-31: 300 mg via INTRAVENOUS

## 2021-01-31 MED ORDER — SODIUM CHLORIDE 0.9 % IV SOLN
INTRAVENOUS | Status: DC | PRN
  Administered 2021-01-31: 500 mL
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
## 2021-01-31 NOTE — Transfer of Care (Signed)
Immediate Anesthesia Transfer of Care Note  Patient: Donald Wilson  Procedure(s) Performed: CLOSURE OF LEFT THOROCOTOMY STERNOTOMY AND REPAIR OF LEFT VENTRICLE ON PUMP AND INSERTION OF  RIGHT FEMORAL ARTERIAL LINE   Patient deceased
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
## 2021-02-01 LAB — SURGICAL PATHOLOGY

## 2021-02-03 LAB — TYPE AND SCREEN
ABO/RH(D): O POS
Antibody Screen: NEGATIVE
Unit division: 0
Unit division: 0
Unit division: 0
Unit division: 0
Unit division: 0
Unit division: 0
Unit division: 0
Unit division: 0
Unit division: 0
Unit division: 0
Unit division: 0
Unit division: 0
Unit division: 0
Unit division: 0
Unit division: 0
Unit division: 0
Unit division: 0
Unit division: 0
Unit division: 0
Unit division: 0
Unit division: 0
Unit division: 0
Unit division: 0
Unit division: 0
Unit division: 0
Unit division: 0
Unit division: 0
Unit division: 0
Unit division: 0
Unit division: 0
Unit division: 0
Unit division: 0

## 2021-02-03 LAB — BPAM RBC
Blood Product Expiration Date: 202204272359
Blood Product Expiration Date: 202204272359
Blood Product Expiration Date: 202204292359
Blood Product Expiration Date: 202205062359
Blood Product Expiration Date: 202205062359
Blood Product Expiration Date: 202205062359
Blood Product Expiration Date: 202205062359
Blood Product Expiration Date: 202205062359
Blood Product Expiration Date: 202205062359
Blood Product Expiration Date: 202205062359
Blood Product Expiration Date: 202205062359
Blood Product Expiration Date: 202205062359
Blood Product Expiration Date: 202205072359
Blood Product Expiration Date: 202205072359
Blood Product Expiration Date: 202205072359
Blood Product Expiration Date: 202205072359
Blood Product Expiration Date: 202205072359
Blood Product Expiration Date: 202205072359
Blood Product Expiration Date: 202205072359
Blood Product Expiration Date: 202205072359
Blood Product Expiration Date: 202205072359
Blood Product Expiration Date: 202205072359
Blood Product Expiration Date: 202205082359
Blood Product Expiration Date: 202205082359
Blood Product Expiration Date: 202205082359
Blood Product Expiration Date: 202205102359
Blood Product Expiration Date: 202205102359
Blood Product Expiration Date: 202205102359
Blood Product Expiration Date: 202205102359
Blood Product Expiration Date: 202205102359
Blood Product Expiration Date: 202205102359
Blood Product Expiration Date: 202205102359
ISSUE DATE / TIME: 202204062155
ISSUE DATE / TIME: 202204062155
ISSUE DATE / TIME: 202204062241
ISSUE DATE / TIME: 202204062241
ISSUE DATE / TIME: 202204062244
ISSUE DATE / TIME: 202204062244
ISSUE DATE / TIME: 202204062244
ISSUE DATE / TIME: 202204062244
ISSUE DATE / TIME: 202204062249
ISSUE DATE / TIME: 202204062249
ISSUE DATE / TIME: 202204062249
ISSUE DATE / TIME: 202204062249
ISSUE DATE / TIME: 202204062252
ISSUE DATE / TIME: 202204062252
ISSUE DATE / TIME: 202204062252
ISSUE DATE / TIME: 202204062252
ISSUE DATE / TIME: 202204062255
ISSUE DATE / TIME: 202204062255
ISSUE DATE / TIME: 202204062255
ISSUE DATE / TIME: 202204062255
ISSUE DATE / TIME: 202204062300
ISSUE DATE / TIME: 202204062300
ISSUE DATE / TIME: 202204062300
ISSUE DATE / TIME: 202204070757
ISSUE DATE / TIME: 202204070757
ISSUE DATE / TIME: 202204070757
ISSUE DATE / TIME: 202204070757
ISSUE DATE / TIME: 202204081521
ISSUE DATE / TIME: 202204090300
ISSUE DATE / TIME: 202204090336
ISSUE DATE / TIME: 202204090336
ISSUE DATE / TIME: 202204100902
Unit Type and Rh: 5100
Unit Type and Rh: 5100
Unit Type and Rh: 5100
Unit Type and Rh: 5100
Unit Type and Rh: 5100
Unit Type and Rh: 5100
Unit Type and Rh: 5100
Unit Type and Rh: 5100
Unit Type and Rh: 5100
Unit Type and Rh: 5100
Unit Type and Rh: 5100
Unit Type and Rh: 5100
Unit Type and Rh: 5100
Unit Type and Rh: 5100
Unit Type and Rh: 5100
Unit Type and Rh: 5100
Unit Type and Rh: 5100
Unit Type and Rh: 5100
Unit Type and Rh: 5100
Unit Type and Rh: 5100
Unit Type and Rh: 5100
Unit Type and Rh: 5100
Unit Type and Rh: 5100
Unit Type and Rh: 5100
Unit Type and Rh: 5100
Unit Type and Rh: 5100
Unit Type and Rh: 5100
Unit Type and Rh: 5100
Unit Type and Rh: 5100
Unit Type and Rh: 5100
Unit Type and Rh: 5100
Unit Type and Rh: 5100

## 2021-02-04 MED FILL — Sodium Chloride IV Soln 0.9%: INTRAVENOUS | Qty: 5000 | Status: AC

## 2021-02-04 MED FILL — Heparin Sodium (Porcine) Inj 1000 Unit/ML: INTRAMUSCULAR | Qty: 30 | Status: AC

## 2021-02-04 MED FILL — Sodium Bicarbonate IV Soln 8.4%: INTRAVENOUS | Qty: 350 | Status: AC

## 2021-02-04 MED FILL — Heparin Sodium (Porcine) Inj 1000 Unit/ML: INTRAMUSCULAR | Qty: 40 | Status: AC

## 2021-02-04 MED FILL — Sodium Bicarbonate IV Soln 8.4%: INTRAVENOUS | Qty: 100 | Status: AC

## 2021-02-04 MED FILL — Calcium Chloride Inj 10%: INTRAVENOUS | Qty: 30 | Status: AC

## 2021-02-04 MED FILL — Electrolyte-R (PH 7.4) Solution: INTRAVENOUS | Qty: 5000 | Status: AC

## 2021-02-24 DEATH — deceased
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
